# Patient Record
Sex: Female | Born: 1955 | Race: White | Hispanic: No | Marital: Married | Smoking: Never smoker
Health system: Southern US, Community
[De-identification: ages and names within clinical notes are randomized; demographics above are authoritative.]

## PROBLEM LIST (undated history)

## (undated) DIAGNOSIS — E079 Disorder of thyroid, unspecified: Secondary | ICD-10-CM

---

## 2015-12-20 DIAGNOSIS — E0590 Thyrotoxicosis, unspecified without thyrotoxic crisis or storm: Secondary | ICD-10-CM

## 2016-11-06 DIAGNOSIS — E7800 Pure hypercholesterolemia, unspecified: Secondary | ICD-10-CM | POA: Insufficient documentation

## 2016-11-06 DIAGNOSIS — M67432 Ganglion, left wrist: Secondary | ICD-10-CM | POA: Insufficient documentation

## 2016-11-07 LAB — CBC AND DIFFERENTIAL
HCT: 33 — AB (ref 36–46)
Hemoglobin: 11 — AB (ref 12.0–16.0)
Platelets: 295 10*3/uL (ref 150–400)
WBC: 4.9

## 2016-11-07 LAB — LIPID PANEL
Cholesterol: 204 — AB (ref 0–200)
HDL: 82 — AB (ref 35–70)
LDL Cholesterol: 111
Triglycerides: 57 (ref 40–160)

## 2016-11-07 LAB — HM HEPATITIS C SCREENING LAB: HM Hepatitis Screen: NEGATIVE

## 2017-08-06 DIAGNOSIS — I10 Essential (primary) hypertension: Secondary | ICD-10-CM | POA: Insufficient documentation

## 2017-08-15 DIAGNOSIS — I639 Cerebral infarction, unspecified: Secondary | ICD-10-CM | POA: Insufficient documentation

## 2020-10-07 DIAGNOSIS — K219 Gastro-esophageal reflux disease without esophagitis: Secondary | ICD-10-CM | POA: Diagnosis not present

## 2020-10-07 DIAGNOSIS — K140 Glossitis: Secondary | ICD-10-CM | POA: Diagnosis not present

## 2020-10-07 DIAGNOSIS — H6123 Impacted cerumen, bilateral: Secondary | ICD-10-CM | POA: Diagnosis not present

## 2020-10-31 DIAGNOSIS — E0590 Thyrotoxicosis, unspecified without thyrotoxic crisis or storm: Secondary | ICD-10-CM | POA: Diagnosis not present

## 2020-11-07 DIAGNOSIS — E0590 Thyrotoxicosis, unspecified without thyrotoxic crisis or storm: Secondary | ICD-10-CM | POA: Diagnosis not present

## 2020-11-07 DIAGNOSIS — E042 Nontoxic multinodular goiter: Secondary | ICD-10-CM | POA: Diagnosis not present

## 2021-01-10 DIAGNOSIS — E0590 Thyrotoxicosis, unspecified without thyrotoxic crisis or storm: Secondary | ICD-10-CM | POA: Diagnosis not present

## 2021-01-10 DIAGNOSIS — E042 Nontoxic multinodular goiter: Secondary | ICD-10-CM | POA: Diagnosis not present

## 2021-05-03 ENCOUNTER — Ambulatory Visit: Payer: Self-pay

## 2021-05-03 ENCOUNTER — Ambulatory Visit
Admission: EM | Admit: 2021-05-03 | Discharge: 2021-05-03 | Disposition: A | Discharge status: Home / Self Care | Payer: Medicare Other | Attending: Family | Admitting: Family

## 2021-05-03 ENCOUNTER — Other Ambulatory Visit: Payer: Self-pay

## 2021-05-03 ENCOUNTER — Ambulatory Visit (INDEPENDENT_AMBULATORY_CARE_PROVIDER_SITE_OTHER): Payer: Medicare Other

## 2021-05-03 DIAGNOSIS — E0590 Thyrotoxicosis, unspecified without thyrotoxic crisis or storm: Secondary | ICD-10-CM

## 2021-05-03 DIAGNOSIS — R509 Fever, unspecified: Secondary | ICD-10-CM | POA: Diagnosis not present

## 2021-05-03 DIAGNOSIS — M4317 Spondylolisthesis, lumbosacral region: Secondary | ICD-10-CM | POA: Insufficient documentation

## 2021-05-03 DIAGNOSIS — M545 Low back pain, unspecified: Secondary | ICD-10-CM

## 2021-05-03 DIAGNOSIS — R109 Unspecified abdominal pain: Secondary | ICD-10-CM | POA: Insufficient documentation

## 2021-05-03 DIAGNOSIS — E079 Disorder of thyroid, unspecified: Secondary | ICD-10-CM

## 2021-05-03 HISTORY — DX: Disorder of thyroid, unspecified: E07.9

## 2021-05-03 LAB — URINALYSIS, COMPLETE (UACMP) WITH MICROSCOPIC
Bilirubin Urine: NEGATIVE
Glucose, UA: NEGATIVE mg/dL
Ketones, ur: 40 mg/dL — AB
Leukocytes,Ua: NEGATIVE
Nitrite: NEGATIVE
Protein, ur: NEGATIVE mg/dL
Specific Gravity, Urine: 1.02 (ref 1.005–1.030)
pH: 5.5 (ref 5.0–8.0)

## 2021-05-03 MED ORDER — IBUPROFEN 100 MG/5ML PO SUSP
600.0000 mg | Freq: Once | ORAL | Status: AC
  Administered 2021-05-03: 600 mg via ORAL

## 2021-05-03 MED ORDER — IBUPROFEN 600 MG PO TABS: 600.0000 mg | ORAL_TABLET | Freq: Once | ORAL | Status: DC

## 2021-05-03 MED ORDER — NITROFURANTOIN MONOHYD MACRO 100 MG PO CAPS: 100.0000 mg | ORAL_CAPSULE | Freq: Two times a day (BID) | ORAL | 0 refills | Status: AC

## 2021-05-03 MED ORDER — MELOXICAM 7.5 MG PO TABS
ORAL_TABLET | ORAL | 0 refills | Status: AC
Start: 2021-05-03 — End: ?

## 2021-05-03 NOTE — Discharge Instructions (Addendum)
Recommend start Macrobid 100mg  twice a day as directed. Continue to push fluids. May take OTC Ibuprofen 600mg  every 6 to 8 hours or Mobic 7.5mg  once to twice a day as needed for pain. Recommend follow-up pending urine culture result and with your PCP as planned. If pain gets worse within 48 hours, go to the ER ASAP.

## 2021-05-03 NOTE — ED Triage Notes (Signed)
Patient is here for "Back Pain'. Started on [id number]. No injury known. Just woke up with this pain, Lower right with radiation to hip/thigh (sciatic). Pain is constant with limiting activity, now moving in left side. No dysuria. No urinary problems.

## 2021-05-03 NOTE — Telephone Encounter (Signed)
Called patient to let her know that she will need to follow up with urgent care or the ER for treatment.

## 2021-05-03 NOTE — Telephone Encounter (Signed)
Summary: severe back pain   Pt called in for assistance. Pt says that she has been having severe back pain since January 10. Pt would further assistance with getting seen sooner if possible. Pt is scheduled to establish care with provider [place] at [facility] in April.   Please assist pt further.      Chief Complaint: Back pain that radiates into both hips now. Hurts to ambulate. Symptoms: Above Frequency: Started 04/25/21. No known injury. Pertinent Negatives: Patient denies numbness. Disposition: [] ED /[] Urgent Care (no appt availability in office) / [] Appointment(In office/virtual)/ []  [facility]/ [] Home Care/ [] Refused Recommended Disposition /[] [facility]/ []  Follow-up with PCP Additional Notes: Pt. Has a new pt. Appointment, but would like to be seen sooner with her back pain. Please advise pt.  Answer Assessment - Initial Assessment Questions 1. ONSET: "When did the pain begin?"      04/25/21 2. LOCATION: "Where does it hurt?" (upper, mid or lower back)     Middle of back and down to hip on right 3. SEVERITY: "How bad is the pain?"  (e.g., Scale 1-10; mild, moderate, or severe)   - MILD (1-3): doesn't interfere with normal activities    - MODERATE (4-7): interferes with normal activities or awakens from sleep    - SEVERE (8-10): excruciating pain, unable to do any normal activities      10 4. PATTERN: "Is the pain constant?" (e.g., yes, no; constant, intermittent)      Constant 5. RADIATION: "Does the pain shoot into your legs or elsewhere?"     Into legs 6. CAUSE:  "What do you think is causing the back pain?"      Unsure 7. BACK OVERUSE:  "Any recent lifting of heavy objects, strenuous work or exercise?"     No 8. MEDICATIONS: "What have you taken so far for the pain?" (e.g., nothing, acetaminophen, NSAIDS)     Advil helped a little 9. NEUROLOGIC SYMPTOMS: "Do you have any weakness, numbness, or problems with bowel/bladder control?"     Hurts to  ambulate 10. OTHER SYMPTOMS: "Do you have any other symptoms?" (e.g., fever, abdominal pain, burning with urination, blood in urine)       No 11. PREGNANCY: "Is there any chance you are pregnant?" (e.g., yes, no; LMP)       No  Protocols used: Back Pain-A-AH

## 2021-05-04 NOTE — ED Provider Notes (Signed)
[facility]    CSN: [id number] Arrival date & time: 05/03/21  1600      History   Chief Complaint Chief Complaint  Patient presents with   Back Pain    HPI Sheila Day is a 66 y.o. female.   66 year old female presents with lower back pain. Woke up with pain on right lower lumbar area about 1 week ago. Thought she may have twisted or strained a muscle but pain continued. No known injury or fall. Yesterday, pain traveled to the front of her right thigh. The pain improved some in the right side of her back but now has moved more to the left side. Pain is constant. She has taken Advil and used a heating pad with minimal relief. Denies any fever or chills but slight low grade fever today. Denies any lower abdominal pain, pain with urination, or unusual vaginal discharge. No previous history of similar back pain. Other chronic health issues include GERD and thyroid disorder. Currently on Tapazole daily. Sensitive to many medications. No current PCP but has an appointment to establish care in March 2023.   The history is provided by the patient.   Past Medical History:  Diagnosis Date   Thyroid condition     Patient Active Problem List   Diagnosis Date Noted   Thyroid condition 05/03/2021    History reviewed. No pertinent surgical history.  OB History   No obstetric history on file.      Home Medications    Prior to Admission medications   Medication Sig Start Date End Date Taking? Authorizing Provider  meloxicam (MOBIC) 7.5 MG tablet Take 1 tablet by mouth once up to twice a day as needed for pain. 05/03/21  Yes Jamarques Pinedo, Nicholes Stairs, NP  methimazole (TAPAZOLE) 5 MG tablet Take 5 mg by mouth daily. 04/23/21  Yes [provider]  nitrofurantoin, macrocrystal-monohydrate, (MACROBID) 100 MG capsule Take 1 capsule (100 mg total) by mouth 2 (two) times daily for 5 days. 05/03/21 05/08/21 Yes Derrik Mceachern, Nicholes Stairs, NP    Family History No family history on  file.  Social History Social History   Tobacco Use   Smoking status: Never    Passive exposure: Never   Smokeless tobacco: Never  Vaping Use   Vaping Use: Never used  Substance Use Topics   Alcohol use: Not Currently   Drug use: Not Currently     Allergies   Codeine, Azithromycin, and Penicillins   Review of Systems Review of Systems  Constitutional:  Positive for activity change, fatigue and fever. Negative for appetite change and chills.  Respiratory:  Negative for cough, chest tightness, shortness of breath and wheezing.   Cardiovascular:  Negative for chest pain and palpitations.  Gastrointestinal:  Positive for nausea. Negative for abdominal pain and vomiting.  Genitourinary:  Positive for flank pain. Negative for decreased urine volume, difficulty urinating, dysuria, frequency, hematuria, pelvic pain, urgency and vaginal discharge.  Musculoskeletal:  Positive for back pain. Negative for arthralgias, neck pain and neck stiffness.  Skin:  Negative for color change and rash.  Allergic/Immunologic: Negative for environmental allergies and immunocompromised state.  Neurological:  Negative for dizziness, tremors, seizures, syncope, speech difficulty, weakness, light-headedness, numbness and headaches.  Hematological:  Negative for adenopathy. Does not bruise/bleed easily.    Physical Exam Triage Vital Signs ED Triage Vitals  Enc Vitals Group     BP 05/03/21 1651 (!) 163/71     Pulse Rate 05/03/21 1651 87  Resp 05/03/21 1651 18     Temp 05/03/21 1651 100 F (37.8 C)     Temp Source 05/03/21 1651 Oral     SpO2 05/03/21 1651 100 %     Weight 05/03/21 1651 115 lb (52.2 kg)     Height 05/03/21 1651 5\' 3"  (1.6 m)     Head Circumference --      Peak Flow --      Pain Score 05/03/21 1650 10     Pain Loc --      Pain Edu? --      Excl. in [place]? --    No data found.  Updated Vital Signs BP (!) 163/71 (BP Location: Left Arm)    Pulse 87    Temp 100 F (37.8 C) (Oral)     Resp 18    Ht 5\' 3"  (1.6 m)    Wt 115 lb (52.2 kg)    LMP  (LMP Unknown)    SpO2 100%    BMI 20.37 kg/m   Visual Acuity Right Eye Distance:   Left Eye Distance:   Bilateral Distance:    Right Eye Near:   Left Eye Near:    Bilateral Near:     Physical Exam Vitals and nursing note reviewed.  Constitutional:      General: She is awake. She is not in acute distress.    Appearance: She is well-developed and well-groomed.     Comments: She is sitting in the exam chair in no acute distress but appears tired and in pain.   HENT:     Head: Normocephalic and atraumatic.     Right Ear: Hearing normal.     Left Ear: Hearing normal.  Eyes:     Extraocular Movements: Extraocular movements intact.     Conjunctiva/sclera: Conjunctivae normal.  Cardiovascular:     Rate and Rhythm: Normal rate and regular rhythm.     Heart sounds: Normal heart sounds. No murmur heard. Pulmonary:     Effort: Pulmonary effort is normal. No respiratory distress.     Breath sounds: Normal breath sounds and air entry. No decreased air movement. No decreased breath sounds, wheezing, rhonchi or rales.  Abdominal:     General: Abdomen is flat. Bowel sounds are normal. There is no distension.     Palpations: Abdomen is soft.     Tenderness: There is no abdominal tenderness. There is left CVA tenderness. There is no right CVA tenderness, guarding or rebound.  Musculoskeletal:        General: Tenderness present.     Cervical back: Normal, normal range of motion and neck supple.     Thoracic back: Normal.     Lumbar back: Tenderness present. No swelling or spasms. Decreased range of motion. Negative right straight leg raise test and negative left straight leg raise test.       Back:     Right lower leg: No edema.     Left lower leg: No edema.     Comments: Has decreased range of motion of lower back, especially with flexion. Slow to change positions. No redness, swelling or rash present. Mild left CVA tenderness. No  muscle spasms present. No neuro deficits noted. Good distal pulses. No peripheral edema. No peripheral numbness.   Skin:    General: Skin is warm and dry.     Capillary Refill: Capillary refill takes less than 2 seconds.     Findings: No rash.  Neurological:     General: No focal  deficit present.     Mental Status: She is alert and oriented to person, place, and time.     Sensory: Sensation is intact. No sensory deficit.     Motor: Motor function is intact.     Gait: Gait is intact.  Psychiatric:        Mood and Affect: Mood normal.        Behavior: Behavior normal. Behavior is cooperative.        Thought Content: Thought content normal.        Judgment: Judgment normal.     UC Treatments / Results  Labs (all labs ordered are listed, but only abnormal results are displayed) Labs Reviewed  URINALYSIS, COMPLETE (UACMP) WITH MICROSCOPIC - Abnormal; Notable for the following components:      Result Value   Hgb urine dipstick TRACE (*)    Ketones, ur 40 (*)    Bacteria, UA RARE (*)    All other components within normal limits  URINE CULTURE    EKG   Radiology DG Lumbar Spine Complete  Result Date: 05/03/2021 CLINICAL DATA:  new lower back pain for about 1 week. No injury or fall. EXAM: LUMBAR SPINE - COMPLETE 4+ VIEW COMPARISON:  None. FINDINGS: Limited evaluation due to overlapping osseous structures and overlying soft tissues. Five non-rib-bearing lumbar vertebral bodies. There is no evidence of lumbar spine fracture. Query grade 1 anterolisthesis of L5 on S1 with query associated bilateral L5 pars interarticularis defects. Otherwise alignment is normal. Moderate to severe intervertebral disc space narrowing at the L5-S1 level. IMPRESSION: 1. No acute displaced fracture or traumatic listhesis of the lumbar spine. 2. Query grade 1 anterolisthesis of L5 on S1 with query associated bilateral L5 pars interarticularis defects. Consider cross-sectional imaging for further evaluation.  Electronically Signed   By: Iven Finn M.D.   On: 05/03/2021 19:54    Procedures Procedures (including critical care time)  Medications Ordered in UC Medications  ibuprofen (ADVIL) 100 MG/5ML suspension 600 mg (600 mg Oral Given 05/03/21 1859)    Initial Impression / Assessment and Plan / UC Course  I have reviewed the triage vital signs and the nursing notes.  Pertinent labs & imaging results that were available during my care of the patient were reviewed by me and considered in my medical decision making (see chart for details).     Due to slight fever and lower back pain, obtained urinalysis. Reviewed UA results with patient- slight blood and rare bacteria present. No definite UTI. Will send urine for culture.  Due to new onset of back pain in a patient over 50 with no history of trauma and pain is worsening, will obtain lumbar imaging. Patient requested medication for pain and gave liquid Ibuprofen 600mg  now.  Reviewed x-ray results with patient- no acute fracture of lumbar spine but grade 1 anterolisthesis of L5 on S1 (slipped vertebrae anteriorly). Also moderate to severe disc space narrowing of L5 to S1. Radiologist recommended further imaging for further evaluation. Reviewed that this may be causing her back pain but needs further evaluation. No numbness, loss of bowel or bladder control or emergent imaging needed at this time. Patient indicated some relief of pain from Ibuprofen given at Urgent Care. Patient does not like to take oral medications but recommend OTC Ibuprofen 600mg  every 6 to 8 hours or Mobic 7.5mg  once to twice a day as needed for pain. Rx written for Mobic and printed for patient to take to pharmacy of choice is she decides to  fill Rx. Since patient has slight blood in urine and rare bacteria along with slight fever, will start antibiotic for possible UTI. Patient allergic to PCN, Keflex and Zithromax. Sensitive to Sulfa in the past. Will trial Macrobid 100mg  twice a  day as directed. May change or stop pending urine culture results. Will continue conservative treatment for back pain but discussed she will need further imaging and may need to go to the ER before seeing her PCP in March. If pain gets worse within 48 hours or any numbness occurs, go to the ER ASAP. Otherwise, follow-up pending urine culture results and with her PCP as planned.  Final Clinical Impressions(s) / UC Diagnoses   Final diagnoses:  Acute bilateral low back pain without sciatica  Flank pain, acute  Low grade fever  Anterolisthesis of lumbosacral spine     Discharge Instructions      Recommend start Macrobid 100mg  twice a day as directed. Continue to push fluids. May take OTC Ibuprofen 600mg  every 6 to 8 hours or Mobic 7.5mg  once to twice a day as needed for pain. Recommend follow-up pending urine culture result and with your PCP as planned. If pain gets worse within 48 hours, go to the ER ASAP.     ED Prescriptions     Medication Sig Dispense Auth. Provider   nitrofurantoin, macrocrystal-monohydrate, (MACROBID) 100 MG capsule Take 1 capsule (100 mg total) by mouth 2 (two) times daily for 5 days. 10 capsule Katy Apo, NP   meloxicam (MOBIC) 7.5 MG tablet Take 1 tablet by mouth once up to twice a day as needed for pain. 20 tablet Coreena Rubalcava, Nicholes Stairs, NP      PDMP not reviewed this encounter.   Katy Apo, NP 05/04/21 1523

## 2021-05-05 LAB — URINE CULTURE: Culture: NO GROWTH

## 2021-06-13 DIAGNOSIS — L814 Other melanin hyperpigmentation: Secondary | ICD-10-CM | POA: Diagnosis not present

## 2021-06-13 DIAGNOSIS — L570 Actinic keratosis: Secondary | ICD-10-CM | POA: Diagnosis not present

## 2021-06-13 DIAGNOSIS — X32XXXS Exposure to sunlight, sequela: Secondary | ICD-10-CM | POA: Diagnosis not present

## 2021-06-13 DIAGNOSIS — I781 Nevus, non-neoplastic: Secondary | ICD-10-CM | POA: Diagnosis not present

## 2021-06-13 DIAGNOSIS — K130 Diseases of lips: Secondary | ICD-10-CM | POA: Diagnosis not present

## 2021-06-13 DIAGNOSIS — L720 Epidermal cyst: Secondary | ICD-10-CM | POA: Diagnosis not present

## 2021-06-28 ENCOUNTER — Encounter: Payer: Self-pay | Admitting: Internal Medicine

## 2021-06-28 ENCOUNTER — Other Ambulatory Visit: Payer: Self-pay

## 2021-06-28 ENCOUNTER — Other Ambulatory Visit: Payer: Self-pay | Admitting: Internal Medicine

## 2021-06-28 ENCOUNTER — Ambulatory Visit (INDEPENDENT_AMBULATORY_CARE_PROVIDER_SITE_OTHER): Payer: Medicare Other | Admitting: Internal Medicine

## 2021-06-28 VITALS — BP 160/70 | HR 93 | Ht 63.0 in | Wt 121.0 lb

## 2021-06-28 DIAGNOSIS — K219 Gastro-esophageal reflux disease without esophagitis: Secondary | ICD-10-CM

## 2021-06-28 DIAGNOSIS — R7989 Other specified abnormal findings of blood chemistry: Secondary | ICD-10-CM | POA: Diagnosis not present

## 2021-06-28 DIAGNOSIS — E0590 Thyrotoxicosis, unspecified without thyrotoxic crisis or storm: Secondary | ICD-10-CM | POA: Diagnosis not present

## 2021-06-28 DIAGNOSIS — E639 Nutritional deficiency, unspecified: Secondary | ICD-10-CM

## 2021-06-28 DIAGNOSIS — I639 Cerebral infarction, unspecified: Secondary | ICD-10-CM

## 2021-06-28 DIAGNOSIS — R030 Elevated blood-pressure reading, without diagnosis of hypertension: Secondary | ICD-10-CM

## 2021-06-28 DIAGNOSIS — M545 Low back pain, unspecified: Secondary | ICD-10-CM

## 2021-06-28 DIAGNOSIS — E7800 Pure hypercholesterolemia, unspecified: Secondary | ICD-10-CM

## 2021-06-28 DIAGNOSIS — M67432 Ganglion, left wrist: Secondary | ICD-10-CM

## 2021-06-28 DIAGNOSIS — K130 Diseases of lips: Secondary | ICD-10-CM | POA: Diagnosis not present

## 2021-06-28 DIAGNOSIS — R3121 Asymptomatic microscopic hematuria: Secondary | ICD-10-CM

## 2021-06-28 DIAGNOSIS — E559 Vitamin D deficiency, unspecified: Secondary | ICD-10-CM | POA: Diagnosis not present

## 2021-06-28 DIAGNOSIS — H6123 Impacted cerumen, bilateral: Secondary | ICD-10-CM | POA: Diagnosis not present

## 2021-06-28 DIAGNOSIS — I10 Essential (primary) hypertension: Secondary | ICD-10-CM

## 2021-06-28 LAB — POCT URINALYSIS DIPSTICK
Bilirubin, UA: NEGATIVE
Blood, UA: NEGATIVE
Glucose, UA: NEGATIVE
Ketones, UA: NEGATIVE
Leukocytes, UA: NEGATIVE
Nitrite, UA: NEGATIVE
Protein, UA: NEGATIVE
Spec Grav, UA: 1.015 (ref 1.010–1.025)
Urobilinogen, UA: 0.2 E.U./dL
pH, UA: 5 (ref 5.0–8.0)

## 2021-06-28 MED ORDER — METHOCARBAMOL 500 MG PO TABS: 500.0000 mg | ORAL_TABLET | Freq: Every evening | ORAL | 0 refills | Status: AC | PRN

## 2021-06-28 MED ORDER — FAMOTIDINE 40 MG PO TABS: 40.0000 mg | ORAL_TABLET | Freq: Every day | ORAL | 1 refills | Status: AC

## 2021-06-28 NOTE — Progress Notes (Signed)
? ? ?Date:  06/28/2021  ? ?Name:  Sheila Day   DOB:  1955-05-17   MRN:  [id number] ? ? ?Chief Complaint: New Patient (Initial Visit) (Bump on edge of right tongue. Seen ENT months ago and was given mouth wash. [facility]. Still having issues with this and just wanted it checked while she is here. Mouth wash did not help at all. Left Ear is clogged with wax.), Back Pain (Pt seen UC Jan 10th for 9 days of back pain. Was told to follow up with PCP but she did not have one at the time. Urine Culture came back Clear. Still has back pain on the right side on and off. Pain radiates down into her right hip. ), and Hyperthyroidism (TSH check, Having a lot of anxiety on and off and fatigue. Wants to check. Wants to check vitamins, and iron to be sure there is no deficiencies present.) ? ? ?Hyperthyroidism: Patient is currently taking methimazole 5 mg daily. She has not had her TSH, T, and T3 checked since September and would like to have this checked along with other possible vitamin deficiencies that could be causing chronic fatigue. Last TSH on 01/10/21 was normal at 0.677. ? ?Mouth Sore: Patient c/o of raised red sore on the tip of the right side of her tongue. Has been present for several months. Seen [facility] who treated her with mouth wash, but had no relief.  ? ?Back Pain ?This is a recurrent problem. The current episode started more than 1 month ago. The problem occurs intermittently. The pain is present in the lumbar spine. The quality of the pain is described as aching. Radiates to: right hip. The pain is moderate. The symptoms are aggravated by twisting and bending. Pertinent negatives include no chest pain, fever or pelvic pain.  ?Ear Fullness  ?There is pain in the left ear. This is a recurrent problem. The current episode started in the past 7 days. The problem occurs every few minutes. There has been no fever. The patient is experiencing no pain. Associated symptoms include coughing and a rash (in the  corners of the lips; raised bump on tip of tongue). Pertinent negatives include no hearing loss, neck pain, rhinorrhea or sore throat.  ?Gastroesophageal Reflux ?She complains of coughing, heartburn and water brash. She reports no chest pain, no sore throat or no wheezing. Associated symptoms include fatigue.  ? ? ?No results found for: NA, K, CO2, GLUCOSE, BUN, CREATININE, CALCIUM, EGFR, GFRNONAA, GLUCOSE ?Lab Results  ?Component Value Date  ? CHOL 204 (A) 11/07/2016  ? HDL 82 (A) 11/07/2016  ? [place] 111 11/07/2016  ? TRIG 57 11/07/2016  ? ?No results found for: TSH ?No results found for: HGBA1C ?Lab Results  ?Component Value Date  ? WBC 4.9 11/07/2016  ? HGB 11.0 (A) 11/07/2016  ? HCT 33 (A) 11/07/2016  ? PLT 295 11/07/2016  ? ?No results found for: ALT, AST, GGT, ALKPHOS, BILITOT ?No results found for: 25OHVITD2, [place], VD25OH  ? ?Review of Systems  ?Constitutional:  Positive for fatigue. Negative for chills, diaphoresis, fever and unexpected weight change.  ?HENT:  Positive for mouth sores. Negative for facial swelling, hearing loss, rhinorrhea, sore throat, tinnitus and trouble swallowing.   ?Respiratory:  Positive for cough. Negative for chest tightness and wheezing.   ?Cardiovascular:  Negative for chest pain, palpitations and leg swelling.  ?Gastrointestinal:  Positive for heartburn. Negative for abdominal distention and constipation.  ?Genitourinary:  Negative for flank pain, hematuria, pelvic  pain and urgency.  ?Musculoskeletal:  Positive for back pain. Negative for myalgias and neck pain.  ?Skin:  Positive for rash (in the corners of the lips; raised bump on tip of tongue).  ?Psychiatric/Behavioral:  Negative for dysphoric mood, sleep disturbance and suicidal ideas. The patient is nervous/anxious.   ? ?Patient Active Problem List  ? Diagnosis Date Noted  ? GERD (gastroesophageal reflux disease) 06/28/2021  ? Bilateral low back pain 06/28/2021  ? Angular cheilitis due to nutritional deficiency  06/28/2021  ? Vitamin D deficiency 06/28/2021  ? Left-sided cerebrovascular accident (CVA) ([place]) 08/15/2017  ? Essential hypertension 08/06/2017  ? Pure hypercholesterolemia 11/06/2016  ? Ganglion cyst of wrist, left 11/06/2016  ? Hyperthyroidism 12/20/2015  ? ? ?Allergies  ?Allergen Reactions  ? Codeine Other (See Comments)  ?  Feels out of it and dilated pupils. ?Other reaction(s): Eyes dilate ?  ? Pantoprazole Other (See Comments)  ?  Stomach pain, insomnia  ? Azithromycin Rash  ? Penicillins Rash  ? ? ?History reviewed. No pertinent surgical history. ? ?Social History  ? ?Tobacco Use  ? Smoking status: Never  ?  Passive exposure: Never  ? Smokeless tobacco: Never  ?Vaping Use  ? Vaping Use: Never used  ?Substance Use Topics  ? Alcohol use: Never  ? Drug use: Never  ? ? ? ?Medication list has been reviewed and updated. ? ?Current Meds  ?Medication Sig  ? ascorbic acid (VITAMIN C) 1000 MG tablet Take 1 tablet by mouth daily.  ? aspirin 81 MG EC tablet 81 mg.  ? Cholecalciferol 25 MCG (1000 UT) capsule Take 1 tablet by mouth daily.  ? Coenzyme Q10 100 MG capsule 100 mg.  ? famotidine (PEPCID) 40 MG tablet Take 1 tablet (40 mg total) by mouth daily.  ? meloxicam (MOBIC) 7.5 MG tablet Take 1 tablet by mouth once up to twice a day as needed for pain.  ? methimazole (TAPAZOLE) 5 MG tablet Take 5 mg by mouth daily.  ? methocarbamol (ROBAXIN) 500 MG tablet Take 1 tablet (500 mg total) by mouth at bedtime as needed for muscle spasms.  ? Multiple Vitamin (MULTI-VITAMIN) tablet Take by mouth.  ? nystatin cream (MYCOSTATIN) SMARTSIG:sparingly Topical Twice Daily  ? Omega-3 Fatty Acids (COROMEGA OMEGA 3 KIDS) EMUL 2 tab PO daily  ? triamcinolone (KENALOG) 0.025 % cream Apply topically.  ? ? ?PHQ 2/9 Scores 06/28/2021  ?PHQ - 2 Score 0  ?PHQ- 9 Score 2  ? ? ?GAD 7 : Generalized Anxiety Score 06/28/2021  ?Nervous, Anxious, on Edge 1  ?Control/stop worrying 0  ?Worry too much - different things 1  ?Trouble relaxing 0  ?Restless 0   ?Easily annoyed or irritable 0  ?Afraid - awful might happen 0  ?Total GAD 7 Score 2  ? ? ?BP Readings from Last 3 Encounters:  ?06/28/21 (!) 160/70  ?05/03/21 (!) 163/71  ? ? ?Physical Exam ?Vitals and nursing note reviewed.  ?Constitutional:   ?   General: She is not in acute distress. ?   Appearance: Normal appearance. She is well-developed.  ?HENT:  ?   Head: Normocephalic and atraumatic.  ?   Right Ear: Hearing and tympanic membrane normal. There is impacted cerumen.  ?   Left Ear: Hearing and tympanic membrane normal. There is impacted cerumen.  ?   Ears:  ?   Comments: Cerumen removed with curette from both canals ?   Mouth/Throat:  ? ?   Comments: Redness and peeling in corners of lips ? ?  Raised papillae on tip of tongue appears benign ?Neck:  ?   Vascular: No carotid bruit.  ?Cardiovascular:  ?   Rate and Rhythm: Normal rate and regular rhythm.  ?   Pulses: Normal pulses.  ?   Heart sounds: Normal heart sounds. No murmur heard. ?Pulmonary:  ?   Effort: Pulmonary effort is normal. No respiratory distress.  ?   Breath sounds: No wheezing or rhonchi.  ?Musculoskeletal:  ?   Cervical back: Normal range of motion. No tenderness.  ?   Lumbar back: Spasms and tenderness present. Negative right straight leg raise test and negative left straight leg raise test.  ?   Right lower leg: No edema.  ?   Left lower leg: No edema.  ?Lymphadenopathy:  ?   Cervical: No cervical adenopathy.  ?Skin: ?   General: Skin is warm and dry.  ?   Capillary Refill: Capillary refill takes less than 2 seconds.  ?   Findings: No rash.  ?Neurological:  ?   Mental Status: She is alert and oriented to person, place, and time.  ?Psychiatric:     ?   Mood and Affect: Mood normal.     ?   Behavior: Behavior normal.  ? ? ?Wt Readings from Last 3 Encounters:  ?06/28/21 121 lb (54.9 kg)  ?05/03/21 115 lb (52.2 kg)  ? ? ?BP (!) 160/70   Pulse 93   Ht _0  (1.6 m)   Wt 121 lb (54.9 kg)   LMP  (LMP Unknown)   SpO2 97%   BMI 21.43 kg/m?   ? ?Assessment and Plan: ?1. Gastroesophageal reflux disease, unspecified whether esophagitis present ?Per patient she has silent GERD but also has some symptomatic GERD as well. ?Did not tolerate Protonix. ?Recommend Pepcid

## 2021-06-28 NOTE — Patient Instructions (Addendum)
Get a BP cuff and measure and record your readings several times per week. ? ?Goal BP is <130/80   Make a follow up appt if you are not at goal at least 75% of the time. ?

## 2021-06-29 LAB — IRON,TIBC AND FERRITIN PANEL
Ferritin: 164 ng/mL — ABNORMAL HIGH (ref 15–150)
Iron Saturation: 24 % (ref 15–55)
Iron: 64 ug/dL (ref 27–139)
Total Iron Binding Capacity: 269 ug/dL (ref 250–450)
UIBC: 205 ug/dL (ref 118–369)

## 2021-06-29 LAB — THYROID PANEL WITH TSH
Free Thyroxine Index: 1.3 (ref 1.2–4.9)
T3 Uptake Ratio: 20 % — ABNORMAL LOW (ref 24–39)
T4, Total: 6.4 ug/dL (ref 4.5–12.0)
TSH: 1.13 u[IU]/mL (ref 0.450–4.500)

## 2021-06-29 LAB — VITAMIN B12: Vitamin B-12: 675 pg/mL (ref 232–1245)

## 2021-06-29 LAB — VITAMIN D 25 HYDROXY (VIT D DEFICIENCY, FRACTURES): Vit D, 25-Hydroxy: 46.5 ng/mL (ref 30.0–100.0)

## 2021-08-23 DIAGNOSIS — K130 Diseases of lips: Secondary | ICD-10-CM | POA: Diagnosis not present

## 2021-08-23 DIAGNOSIS — X32XXXS Exposure to sunlight, sequela: Secondary | ICD-10-CM | POA: Diagnosis not present

## 2021-08-23 DIAGNOSIS — L814 Other melanin hyperpigmentation: Secondary | ICD-10-CM | POA: Diagnosis not present

## 2021-08-23 DIAGNOSIS — Z872 Personal history of diseases of the skin and subcutaneous tissue: Secondary | ICD-10-CM | POA: Diagnosis not present

## 2021-11-17 DIAGNOSIS — E042 Nontoxic multinodular goiter: Secondary | ICD-10-CM | POA: Diagnosis not present

## 2021-11-17 DIAGNOSIS — E0590 Thyrotoxicosis, unspecified without thyrotoxic crisis or storm: Secondary | ICD-10-CM | POA: Diagnosis not present

## 2021-12-01 ENCOUNTER — Encounter: Payer: Self-pay | Admitting: Internal Medicine

## 2021-12-01 ENCOUNTER — Ambulatory Visit (INDEPENDENT_AMBULATORY_CARE_PROVIDER_SITE_OTHER): Payer: Medicare Other | Admitting: Internal Medicine

## 2021-12-01 VITALS — BP 144/80 | HR 76 | Temp 98.5°F | Ht 63.0 in | Wt 124.0 lb

## 2021-12-01 DIAGNOSIS — H6992 Unspecified Eustachian tube disorder, left ear: Secondary | ICD-10-CM | POA: Diagnosis not present

## 2021-12-01 DIAGNOSIS — D329 Benign neoplasm of meninges, unspecified: Secondary | ICD-10-CM | POA: Diagnosis not present

## 2021-12-01 MED ORDER — FLUTICASONE PROPIONATE 50 MCG/ACT NA SUSP: 2.0000 | Freq: Every day | NASAL | 6 refills | Status: AC

## 2021-12-01 NOTE — Progress Notes (Signed)
Date:  12/01/2021   Name:  Sheila Day   DOB:  1955-12-09   MRN:  [id number]   Chief Complaint: Ear Pain (Left ear, pressure, hears crackling and buzzing in ear /)  Otalgia  There is pain in the left ear. This is a new problem. Episode onset: X2 weeks. The problem has been unchanged (come and goes). There has been no fever. The pain is at a severity of 5/10. The pain is mild. Pertinent negatives include no coughing or sore throat. Associated symptoms comments: Pressure with crackling and buzzing noise . She has tried nothing for the symptoms.    No results found for: "NA", "K", "CO2", "GLUCOSE", "BUN", "CREATININE", "CALCIUM", "EGFR", "GFRNONAA" Lab Results  Component Value Date   CHOL 204 (A) 11/07/2016   HDL 82 (A) 11/07/2016   LDLCALC 111 11/07/2016   TRIG 57 11/07/2016   Lab Results  Component Value Date   TSH 1.130 06/28/2021   No results found for: "HGBA1C" Lab Results  Component Value Date   WBC 4.9 11/07/2016   HGB 11.0 (A) 11/07/2016   HCT 33 (A) 11/07/2016   PLT 295 11/07/2016   No results found for: "ALT", "AST", "GGT", "ALKPHOS", "BILITOT" Lab Results  Component Value Date   VD25OH 46.5 06/28/2021     Review of Systems  Constitutional:  Negative for chills, fatigue and fever.  HENT:  Positive for ear pain and sinus pressure. Negative for sore throat and trouble swallowing.   Respiratory:  Negative for cough and shortness of breath.   Cardiovascular:  Negative for chest pain.  Psychiatric/Behavioral:  Negative for sleep disturbance. The patient is not nervous/anxious.     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 Other reaction(s): Eyes dilate    Pantoprazole Other (See Comments)    Stomach pain, insomnia   Azithromycin Rash   Penicillins Rash    History reviewed. No pertinent surgical history.  Social History   Tobacco Use   Smoking status: Never    Passive exposure: Never   Smokeless tobacco: Never  Vaping Use   Vaping Use: Never used  Substance Use Topics   Alcohol use: Never   Drug use: Never     Medication list has been reviewed and updated.  Current Meds  Medication Sig   fluticasone (FLONASE) 50 MCG/ACT nasal spray Place 2 sprays into both nostrils daily.       12/01/2021    1:47 PM 06/28/2021    2:43 PM  GAD 7 : Generalized Anxiety Score  Nervous, Anxious, on Edge 1 1  Control/stop worrying 0 0  Worry too much - different things 1 1  Trouble relaxing 0 0  Restless 0 0  Easily annoyed or irritable 0 0  Afraid - awful might happen 0 0  Total GAD 7 Score 2 2  Anxiety Difficulty Not difficult at all        12/01/2021    1:47 PM 06/28/2021    2:43 PM  Depression screen PHQ  2/9  Decreased Interest 0 0  Down, Depressed, Hopeless 0 0  PHQ - 2 Score 0 0  Altered sleeping 0 1  Tired, decreased energy 1 1  Change in appetite 0 0  Feeling bad or failure about yourself  0 0  Trouble concentrating 0 0  Moving slowly or fidgety/restless 0 0  Suicidal thoughts 0 0  PHQ-9 Score 1 2  Difficult doing work/chores Not difficult at all Not difficult at all    BP Readings from Last 3 Encounters:  12/01/21 (!) 144/80  06/28/21 (!) 160/70  05/03/21 (!) 163/71    Physical Exam Constitutional:      Appearance: Normal appearance. She is well-developed.  HENT:     Right Ear: Ear canal and external ear normal. Tympanic membrane is not erythematous or retracted.     Left Ear: Ear canal and external  ear normal. Tympanic membrane is retracted. Tympanic membrane is not erythematous.     Ears:     Comments: Small amount of cerumen removed from the left canal with curette    Nose:     Right Sinus: Maxillary sinus tenderness present. No frontal sinus tenderness.     Left Sinus: Maxillary sinus tenderness present. No frontal sinus tenderness.     Comments: Minimal sinus tenderness    Mouth/Throat:     Mouth: No oral lesions.     Pharynx: Uvula midline. No oropharyngeal exudate or posterior oropharyngeal erythema.  Cardiovascular:     Rate and Rhythm: Normal rate and regular rhythm.     Heart sounds: Normal heart sounds.  Pulmonary:     Breath sounds: Normal breath sounds. No wheezing or rales.  Musculoskeletal:     Cervical back: Normal range of motion.  Lymphadenopathy:     Cervical: No cervical adenopathy.  Neurological:     Mental Status: She is alert and oriented to person, place, and time.     Wt Readings from Last 3 Encounters:  12/01/21 124 lb (56.2 kg)  06/28/21 121 lb (54.9 kg)  05/03/21 115 lb (52.2 kg)    BP (!) 144/80 (BP Location: Right Arm, Cuff Size: Normal)   Pulse 76   Temp 98.5 F (36.9 C) (Oral)   Ht 5' 3" (1.6 m)   Wt 124 lb (56.2 kg)   LMP  (LMP Unknown)   SpO2 97%   BMI 21.97 kg/m   Assessment and Plan: 1. Eustachian tube disorder, left Recommend Flonase spray daily Tylenol if needed for discomfort No evidence of infection at this time - fluticasone (FLONASE) 50 MCG/ACT nasal spray; Place 2 sprays into both nostrils daily.  Dispense: 16 g; Refill: 6  2. 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 Any errors are unintentional.  Halina Maidens, MD [facility]  12/01/2021

## 2022-01-10 DIAGNOSIS — E042 Nontoxic multinodular goiter: Secondary | ICD-10-CM | POA: Diagnosis not present

## 2022-01-22 DIAGNOSIS — H35371 Puckering of macula, right eye: Secondary | ICD-10-CM | POA: Diagnosis not present

## 2022-01-29 ENCOUNTER — Telehealth: Payer: Self-pay | Admitting: Internal Medicine

## 2022-01-29 NOTE — Telephone Encounter (Signed)
Copied from [place] [phone]. Topic: Medicare AWV >> Jan 29, 2022 11:44 AM Jae Dire wrote: Reason for CRM:  Left message for patient to call back and schedule Medicare Annual Wellness Visit (AWV) in office.   If unable to come into the office for AWV,  please offer to do virtually or by telephone.  No hx of AWV eligible for AWVI per [facility] as of 12/15/2021  Please schedule at any time with [facility] -[facility]urse Health Advisor.      45 minute appointment   Any questions, please call me at [phone]

## 2022-02-07 DIAGNOSIS — H9202 Otalgia, left ear: Secondary | ICD-10-CM | POA: Diagnosis not present

## 2022-02-08 NOTE — Progress Notes (Signed)
     Attempted to contact the patient several times, no answer. Left a detail message on the patient voicemail.

## 2022-02-08 NOTE — Patient Instructions (Signed)

## 2022-02-09 ENCOUNTER — Ambulatory Visit: Payer: Medicare Other

## 2022-02-09 DIAGNOSIS — Z0000 Encounter for general adult medical examination without abnormal findings: Secondary | ICD-10-CM

## 2022-02-09 DIAGNOSIS — Z91199 Patient's noncompliance with other medical treatment and regimen due to unspecified reason: Secondary | ICD-10-CM

## 2022-02-23 ENCOUNTER — Telehealth: Payer: Self-pay | Admitting: Internal Medicine

## 2022-02-23 NOTE — Telephone Encounter (Signed)
Copied from [place] [phone]. Topic: General - Other >> Feb 23, 2022  3:46 PM Everette C wrote: Reason for CRM: The patient would like to speak with a member of administrative staff regarding a bill when possible  The patient has questions regarding  a visit on 02/09/22  Please contact further when available

## 2022-04-12 ENCOUNTER — Other Ambulatory Visit
Admission: RE | Admit: 2022-04-12 | Discharge: 2022-04-12 | Disposition: A | Discharge status: Home / Self Care | Payer: Medicare Other | Source: Ambulatory Visit | Attending: Ophthalmology | Admitting: Ophthalmology

## 2022-04-12 DIAGNOSIS — M316 Other giant cell arteritis: Secondary | ICD-10-CM | POA: Diagnosis not present

## 2022-04-12 DIAGNOSIS — H43813 Vitreous degeneration, bilateral: Secondary | ICD-10-CM | POA: Diagnosis not present

## 2022-04-12 LAB — CBC WITH DIFFERENTIAL/PLATELET
Abs Immature Granulocytes: 0.01 10*3/uL (ref 0.00–0.07)
Basophils Absolute: 0.1 10*3/uL (ref 0.0–0.1)
Basophils Relative: 1 %
Eosinophils Absolute: 0.1 10*3/uL (ref 0.0–0.5)
Eosinophils Relative: 1 %
HCT: 36.7 % (ref 36.0–46.0)
Hemoglobin: 12.2 g/dL (ref 12.0–15.0)
Immature Granulocytes: 0 %
Lymphocytes Relative: 36 %
Lymphs Abs: 1.6 10*3/uL (ref 0.7–4.0)
MCH: 33.8 pg (ref 26.0–34.0)
MCHC: 33.2 g/dL (ref 30.0–36.0)
MCV: 101.7 fL — ABNORMAL HIGH (ref 80.0–100.0)
Monocytes Absolute: 0.3 10*3/uL (ref 0.1–1.0)
Monocytes Relative: 8 %
Neutro Abs: 2.3 10*3/uL (ref 1.7–7.7)
Neutrophils Relative %: 54 %
Platelets: 309 10*3/uL (ref 150–400)
RBC: 3.61 MIL/uL — ABNORMAL LOW (ref 3.87–5.11)
RDW: 13.2 % (ref 11.5–15.5)
WBC: 4.4 10*3/uL (ref 4.0–10.5)
nRBC: 0 % (ref 0.0–0.2)

## 2022-04-12 LAB — C-REACTIVE PROTEIN: CRP: 0.5 mg/dL (ref <1.0)

## 2022-04-12 LAB — SEDIMENTATION RATE: Sed Rate: 45 mm/hr — ABNORMAL HIGH (ref 0–30)

## 2022-05-14 ENCOUNTER — Telehealth: Payer: Self-pay | Admitting: Internal Medicine

## 2022-05-14 NOTE — Telephone Encounter (Signed)
Copied from [place] [phone]. Topic: Medicare AWV >> May 14, 2022  1:26 PM Devoria Glassing wrote: Reason for CRM: Left message tor patient to schedule Medicare Annual Wellness Visit (AWV) with [place], [place]  Appointment can be an offiice/telephone or virtual visit;  Please call [phone] ask for Juliann Pulse.

## 2022-06-06 ENCOUNTER — Telehealth: Payer: Self-pay | Admitting: Internal Medicine

## 2022-06-06 NOTE — Telephone Encounter (Signed)
Copied from [place] [phone]. Topic: Medicare AWV >> Jun 06, 2022  3:22 PM Devoria Glassing wrote: Reason for CRM: Called patient to schedule Medicare Annual Wellness Visit (AWV). Left message for patient to call back and schedule Medicare Annual Wellness Visit (AWV).  Last date of AWV: none   Please schedule an appointment at any time with Kirke Shaggy, [facility]    If any questions, please contact me.  Thank you ,  Sherol Dade; [place] Direct Dial: [phone]

## 2022-06-13 DIAGNOSIS — M26622 Arthralgia of left temporomandibular joint: Secondary | ICD-10-CM | POA: Diagnosis not present

## 2022-06-13 DIAGNOSIS — H9202 Otalgia, left ear: Secondary | ICD-10-CM | POA: Diagnosis not present

## 2022-06-13 DIAGNOSIS — H6983 Other specified disorders of Eustachian tube, bilateral: Secondary | ICD-10-CM | POA: Diagnosis not present

## 2022-06-19 DIAGNOSIS — R293 Abnormal posture: Secondary | ICD-10-CM | POA: Diagnosis not present

## 2022-06-19 DIAGNOSIS — M2569 Stiffness of other specified joint, not elsewhere classified: Secondary | ICD-10-CM | POA: Diagnosis not present

## 2022-06-19 DIAGNOSIS — M2669 Other specified disorders of temporomandibular joint: Secondary | ICD-10-CM | POA: Diagnosis not present

## 2022-06-21 DIAGNOSIS — E063 Autoimmune thyroiditis: Secondary | ICD-10-CM | POA: Diagnosis not present

## 2022-06-26 DIAGNOSIS — R700 Elevated erythrocyte sedimentation rate: Secondary | ICD-10-CM | POA: Diagnosis not present

## 2022-06-26 DIAGNOSIS — D7589 Other specified diseases of blood and blood-forming organs: Secondary | ICD-10-CM | POA: Diagnosis not present

## 2022-06-26 DIAGNOSIS — R5383 Other fatigue: Secondary | ICD-10-CM | POA: Diagnosis not present

## 2022-06-26 DIAGNOSIS — E042 Nontoxic multinodular goiter: Secondary | ICD-10-CM | POA: Diagnosis not present

## 2022-06-26 DIAGNOSIS — E0590 Thyrotoxicosis, unspecified without thyrotoxic crisis or storm: Secondary | ICD-10-CM | POA: Diagnosis not present

## 2022-06-28 DIAGNOSIS — R293 Abnormal posture: Secondary | ICD-10-CM | POA: Diagnosis not present

## 2022-06-28 DIAGNOSIS — M2569 Stiffness of other specified joint, not elsewhere classified: Secondary | ICD-10-CM | POA: Diagnosis not present

## 2022-06-28 DIAGNOSIS — M2669 Other specified disorders of temporomandibular joint: Secondary | ICD-10-CM | POA: Diagnosis not present

## 2022-07-27 DIAGNOSIS — H35371 Puckering of macula, right eye: Secondary | ICD-10-CM | POA: Diagnosis not present

## 2022-12-31 DIAGNOSIS — H9201 Otalgia, right ear: Secondary | ICD-10-CM | POA: Diagnosis not present

## 2022-12-31 DIAGNOSIS — M26603 Bilateral temporomandibular joint disorder, unspecified: Secondary | ICD-10-CM | POA: Diagnosis not present

## 2022-12-31 DIAGNOSIS — H6123 Impacted cerumen, bilateral: Secondary | ICD-10-CM | POA: Diagnosis not present

## 2023-01-28 ENCOUNTER — Telehealth: Payer: Self-pay | Admitting: Internal Medicine

## 2023-01-28 NOTE — Telephone Encounter (Signed)
Spoke with patient to scheduled her an appointment but was informed that she moved to [place].

## 2023-12-30 IMAGING — CR DG LUMBAR SPINE COMPLETE 4+V
5 series · 5 of 5 positions shown · non-contrast
Comparison: None.

CLINICAL DATA: new lower back pain for about 1 week. No injury or
fall.

EXAM:
LUMBAR SPINE - COMPLETE 4+ VIEW

[l-spine ap]
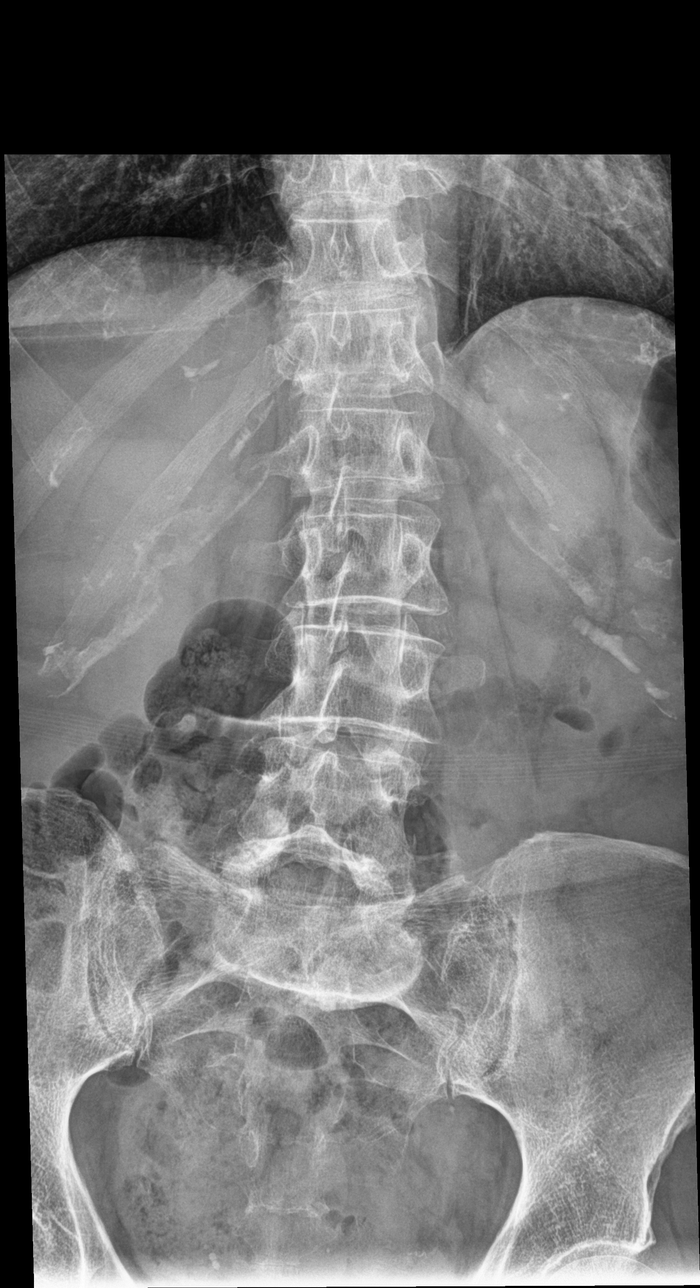

[l-spine obl (1 of 2)]
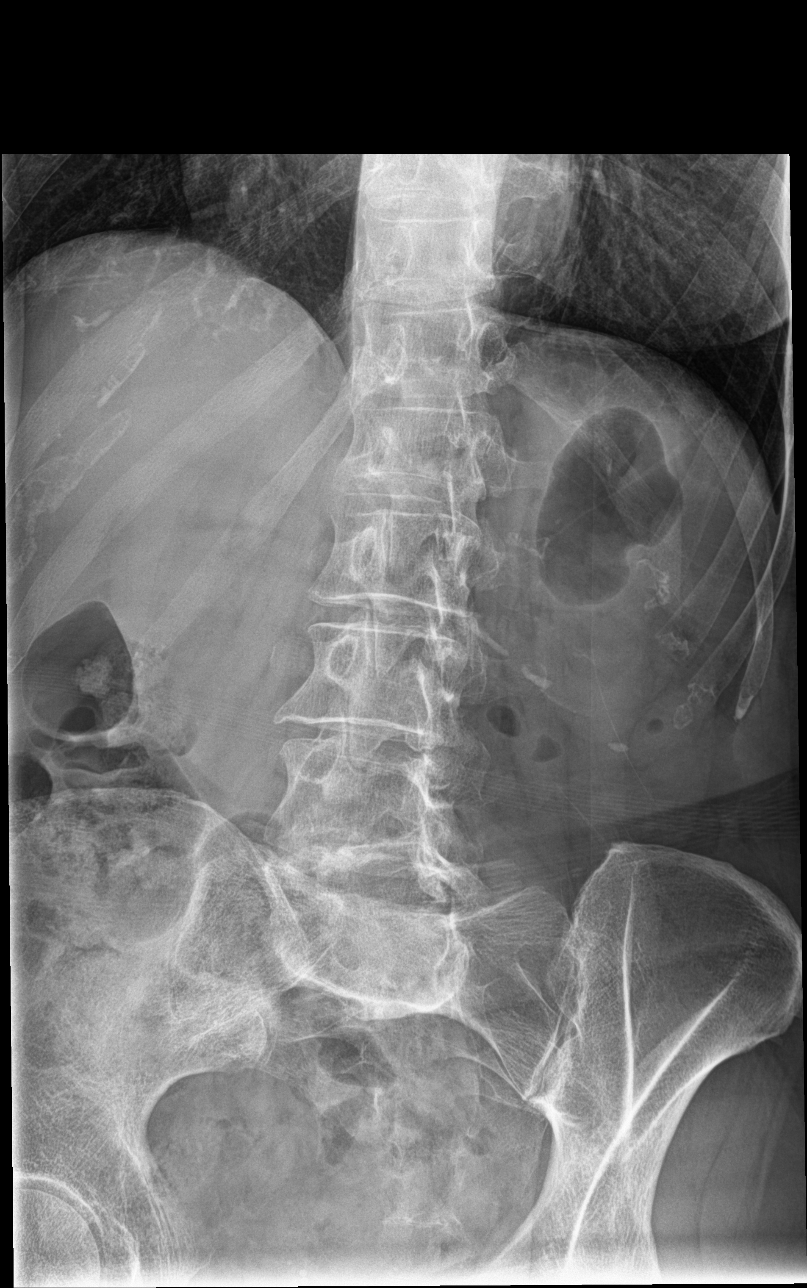

[l-spine obl (2 of 2)]
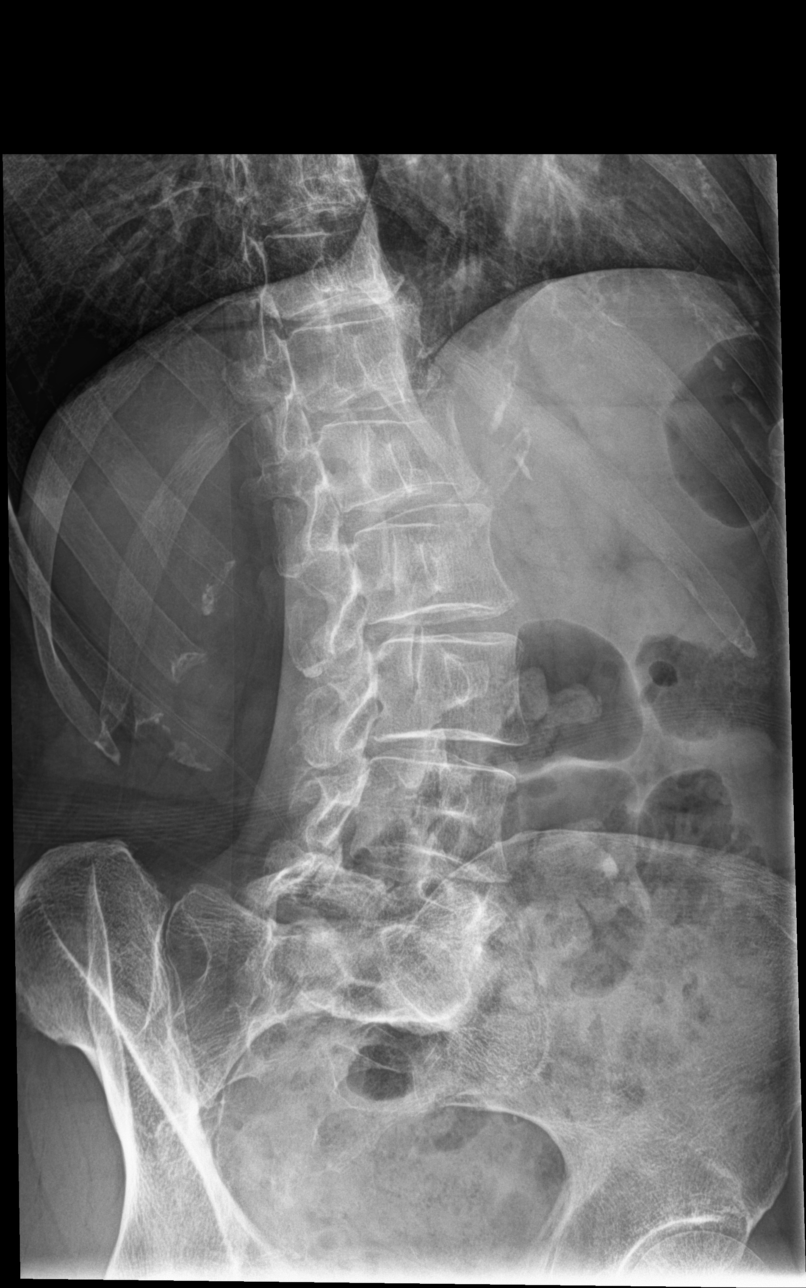

[l-spine lat]
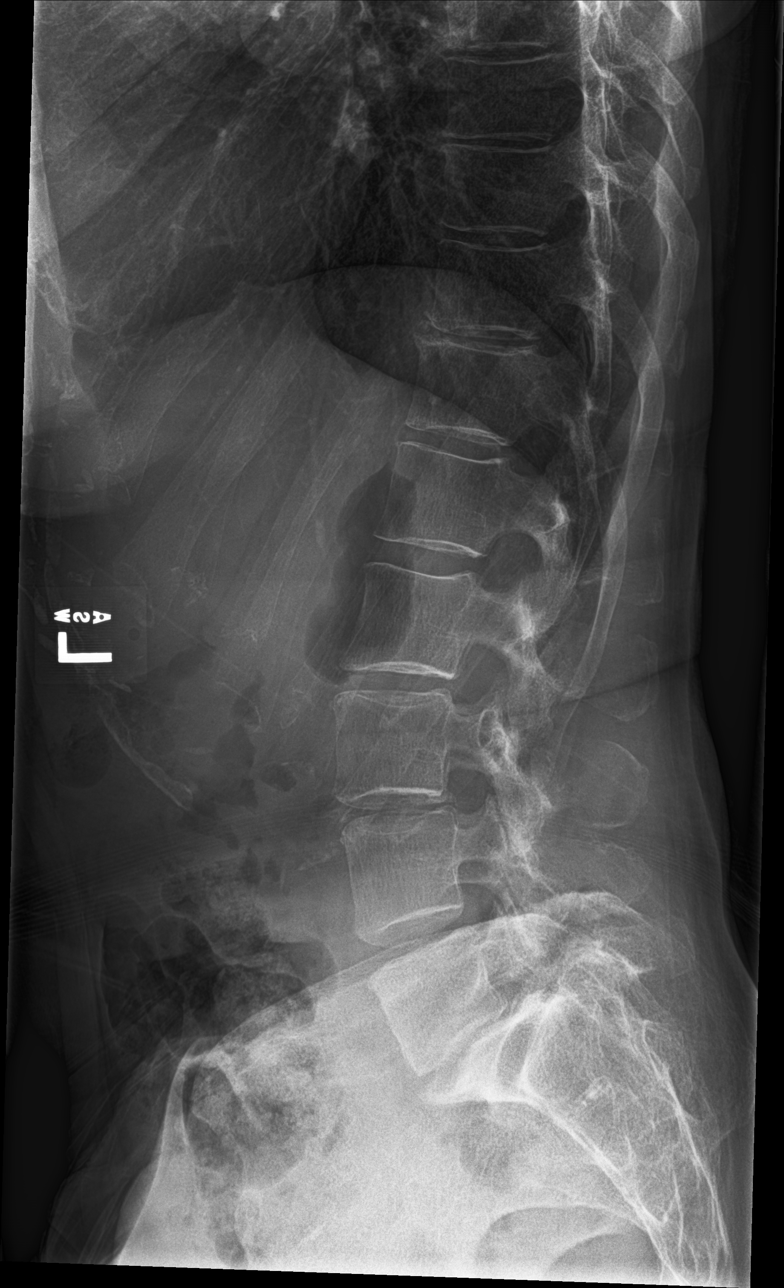

[l-spine spot]
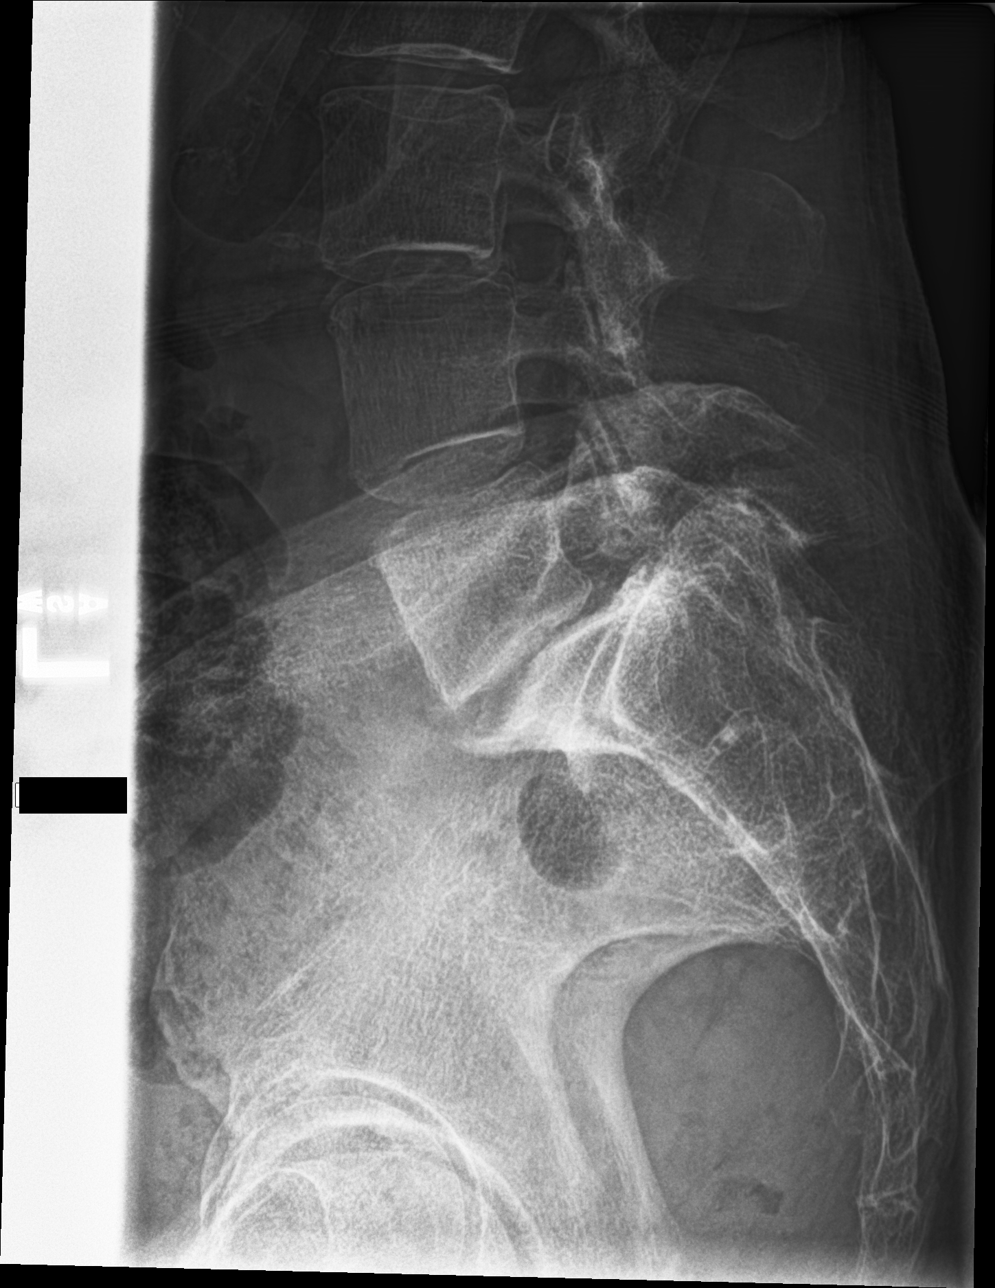

[5 of 5 positions shown; findings below may reference images not displayed]

FINDINGS: Limited evaluation due to overlapping osseous structures and
overlying soft tissues. Five non-rib-bearing lumbar vertebral
bodies. There is no evidence of lumbar spine fracture. Query grade 1
anterolisthesis of L5 on S1 with query associated bilateral L5 pars
interarticularis defects. Otherwise alignment is normal. Moderate to
severe intervertebral disc space narrowing at the L5-S1 level.
IMPRESSION: 1. No acute displaced fracture or traumatic listhesis of the lumbar
spine.
2. Query grade 1 anterolisthesis of L5 on S1 with query associated
bilateral L5 pars interarticularis defects. Consider cross-sectional
imaging for further evaluation.
